# Patient Record
Sex: Female | Born: 1995 | Race: Black or African American | Hispanic: No | Marital: Single | Smoking: Never smoker
Health system: Southern US, Community
[De-identification: ages and names within clinical notes are randomized; demographics above are authoritative.]

## PROBLEM LIST (undated history)

## (undated) DIAGNOSIS — S060X9A Concussion with loss of consciousness of unspecified duration, initial encounter: Secondary | ICD-10-CM

## (undated) DIAGNOSIS — S060XAA Concussion with loss of consciousness status unknown, initial encounter: Secondary | ICD-10-CM

---
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
## 2017-10-30 ENCOUNTER — Ambulatory Visit (INDEPENDENT_AMBULATORY_CARE_PROVIDER_SITE_OTHER): Payer: Managed Care, Other (non HMO) | Admitting: Sports Medicine

## 2017-10-30 ENCOUNTER — Encounter: Payer: Self-pay | Admitting: Sports Medicine

## 2017-10-30 VITALS — BP 110/64 | Ht 61.0 in | Wt 123.0 lb

## 2017-10-30 DIAGNOSIS — M2141 Flat foot [pes planus] (acquired), right foot: Secondary | ICD-10-CM | POA: Diagnosis not present

## 2017-10-30 DIAGNOSIS — M7652 Patellar tendinitis, left knee: Secondary | ICD-10-CM | POA: Diagnosis not present

## 2017-10-30 DIAGNOSIS — M2142 Flat foot [pes planus] (acquired), left foot: Secondary | ICD-10-CM

## 2017-10-30 DIAGNOSIS — M7651 Patellar tendinitis, right knee: Secondary | ICD-10-CM | POA: Diagnosis not present

## 2017-10-30 NOTE — Progress Notes (Signed)
   Subjective:    Patient ID: Gabriella Mccoy, female    DOB: 12-Jan-1996, 22 y.o.   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  No injury that she can recall but a gradual onset of pain that is worse with running and jumping.  She denies any swelling.  No mechanical symptoms. She is also requesting a new pair of custom orthotics.  She had custom orthotics created one year ago which were very comfortable but have since worn out.  Interim medical history reviewed Medications reviewed Allergies reviewed    Review of Systems    As above Objective:   Physical Exam  Well-developed, well-nourished.  No acute distress.  Awake alert and oriented x3.  Vital signs reviewed  Examination of both knees show full range of motion.  No effusion.  She does have palpable crepitus over the bilateral patellar tendons. Knees are stable to ligamentous exam.  No joint line tenderness.  Patient has pes planus with standing.      Assessment & Plan:  Bilateral patellar tendinitis Pes planus  Custom orthotics were created for her today.  She will start decline squats in the training room and she will be fitted with bilateral patellar straps to wear when active.  I recommended ice and anti-inflammatories as needed.  I will follow her progress through the training room at [facility].  She will follow-up with me formally as needed. Total time spent with the patient was 30 minutes with greater than 50% of the time spent in face-to-face consultation discussing her diagnosis of patellar tendinitis as well as constructing and fitting her orthotics.

## 2017-11-28 ENCOUNTER — Ambulatory Visit (INDEPENDENT_AMBULATORY_CARE_PROVIDER_SITE_OTHER): Payer: 59 | Admitting: Sports Medicine

## 2017-11-28 VITALS — BP 108/60 | Ht 61.5 in | Wt 120.0 lb

## 2017-11-28 DIAGNOSIS — M2141 Flat foot [pes planus] (acquired), right foot: Secondary | ICD-10-CM | POA: Diagnosis not present

## 2017-11-28 DIAGNOSIS — M7662 Achilles tendinitis, left leg: Secondary | ICD-10-CM

## 2017-11-28 DIAGNOSIS — M2142 Flat foot [pes planus] (acquired), left foot: Secondary | ICD-10-CM | POA: Diagnosis not present

## 2017-11-28 DIAGNOSIS — M7661 Achilles tendinitis, right leg: Secondary | ICD-10-CM

## 2017-11-29 ENCOUNTER — Encounter: Payer: Self-pay | Admitting: Sports Medicine

## 2017-11-29 NOTE — Progress Notes (Addendum)
   Subjective:    Patient ID: Gabriella Mccoy, female    DOB: 07-06-1995, 22 y.o.   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  She has had custom orthotics in the past.  She has a history of patellar tendinopathy and pes planus.  She would like new orthotics for her basketball shoes.  Interim medical history reviewed Medications reviewed Allergies reviewed    Review of Systems As above    Objective:   Physical Exam  Well-developed, well-nourished.  No acute distress.  Awake alert and oriented x3.  Vital signs reviewed.  Patient has significant pes planus with standing.  Pronation with walking.  No soft tissue swelling.  Good pulses.  Walking without a limp.     Assessment & Plan:   Pes planus History of patellar tendinopathy  Custom orthotics were created for her today.  She found them to be comfortable prior to leaving the office.  Good correction of her pes planus and pronation with orthotics in place.  Total time spent with the patient was 30 minutes with greater than 50% of the time spent in face-to-face consultation discussing orthotic construction, instruction, and fitting.  I will continue to follow her progress through the training room at [employer] [facility] she will follow-up with me formally in the office as needed.  Patient was fitted for a : standard, cushioned, semi-rigid orthotic. The orthotic was heated and afterward the patient stood on the orthotic blank positioned on the orthotic stand. The patient was positioned in subtalar neutral position and 10 degrees of ankle dorsiflexion in a weight bearing stance. After completion of molding, a stable base was applied to the orthotic blank. The blank was ground to a stable position for weight bearing. Size: 8 Base: Blue EVA.  Posting: none Additional orthotic padding: none

## 2018-06-19 IMAGING — CR DG CHEST 2V
2 series · 2 of 2 positions shown · non-contrast
Comparison: None.

CLINICAL DATA: Right upper quadrant pain and shortness of breath
for 1 week, initial encounter

EXAM:
CHEST  2 VIEW

[w chest pa]
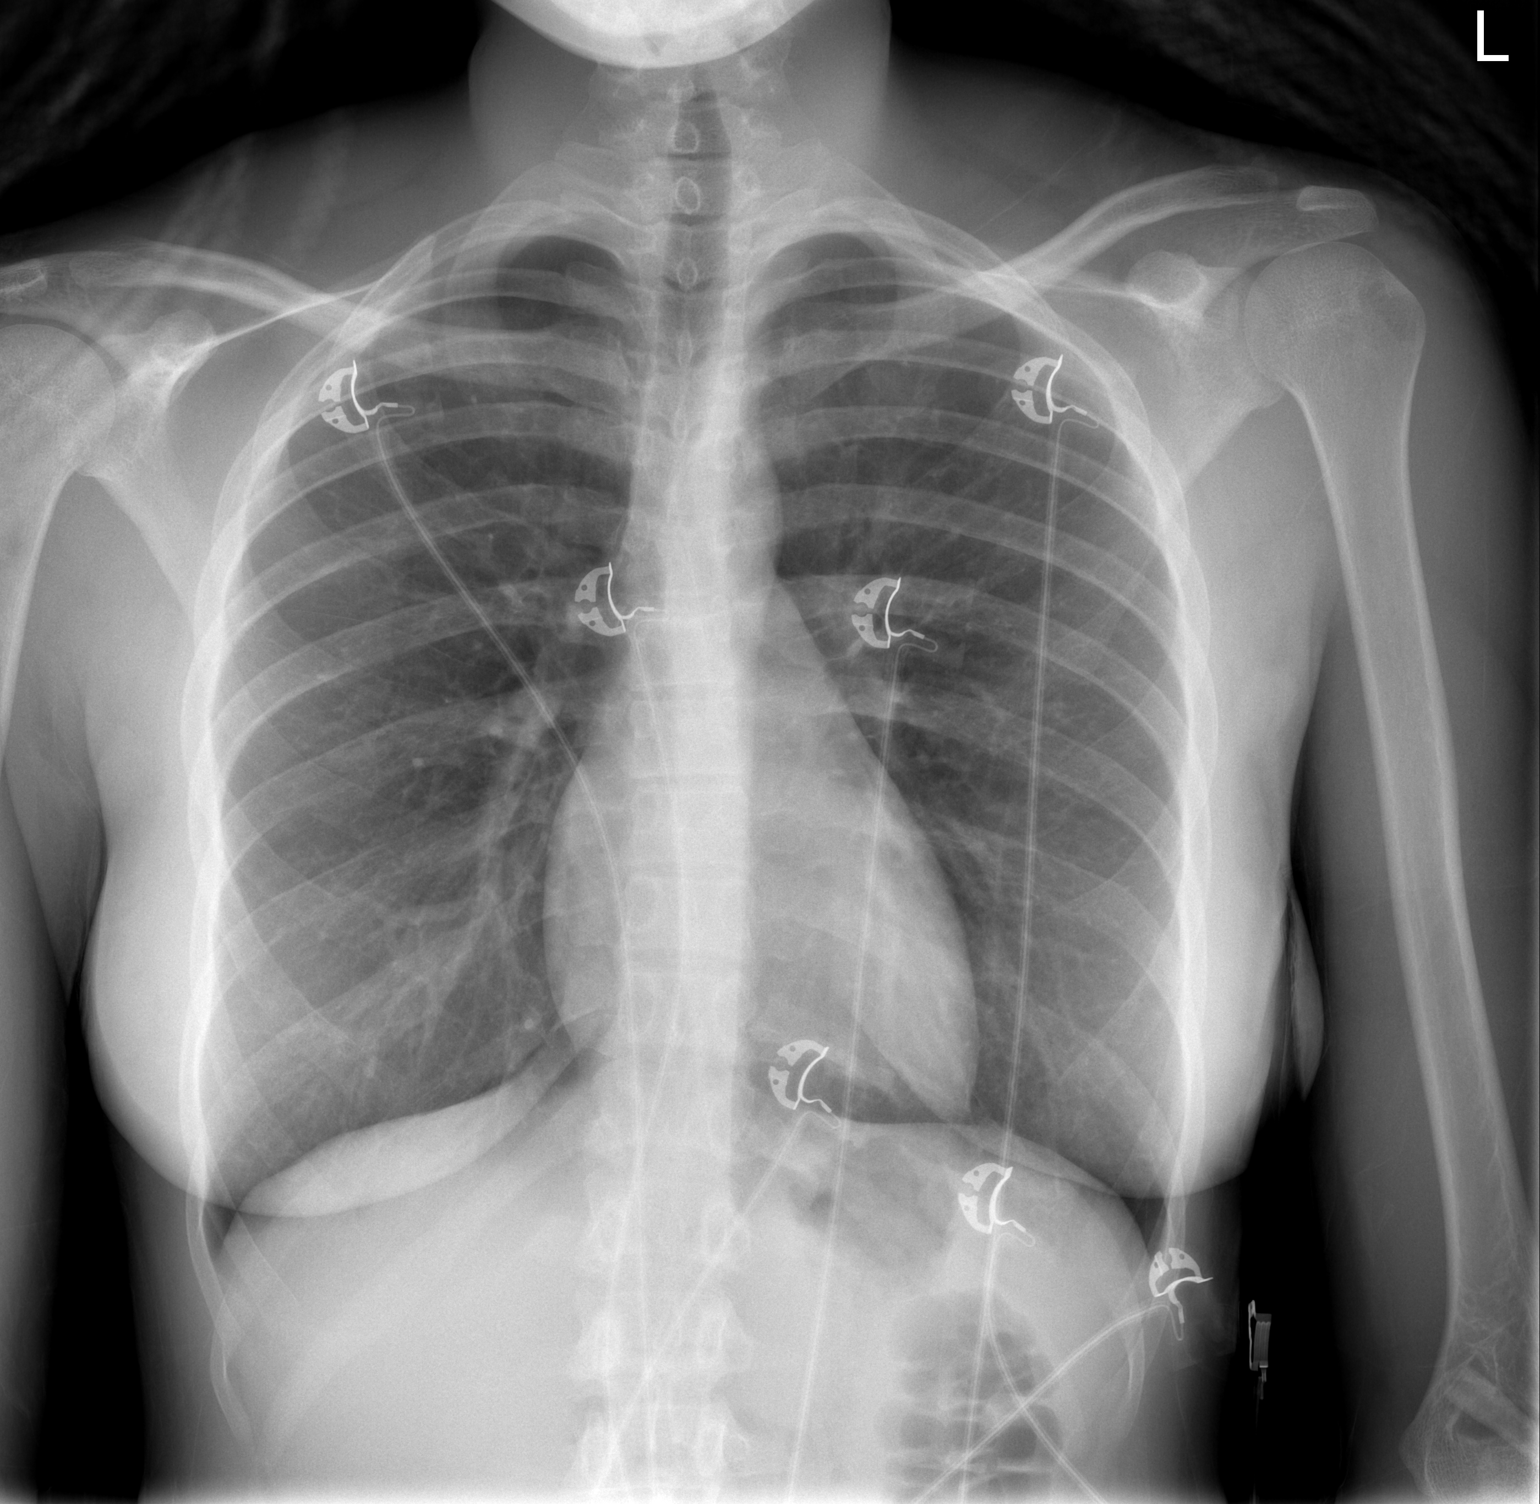

[w chest lat]
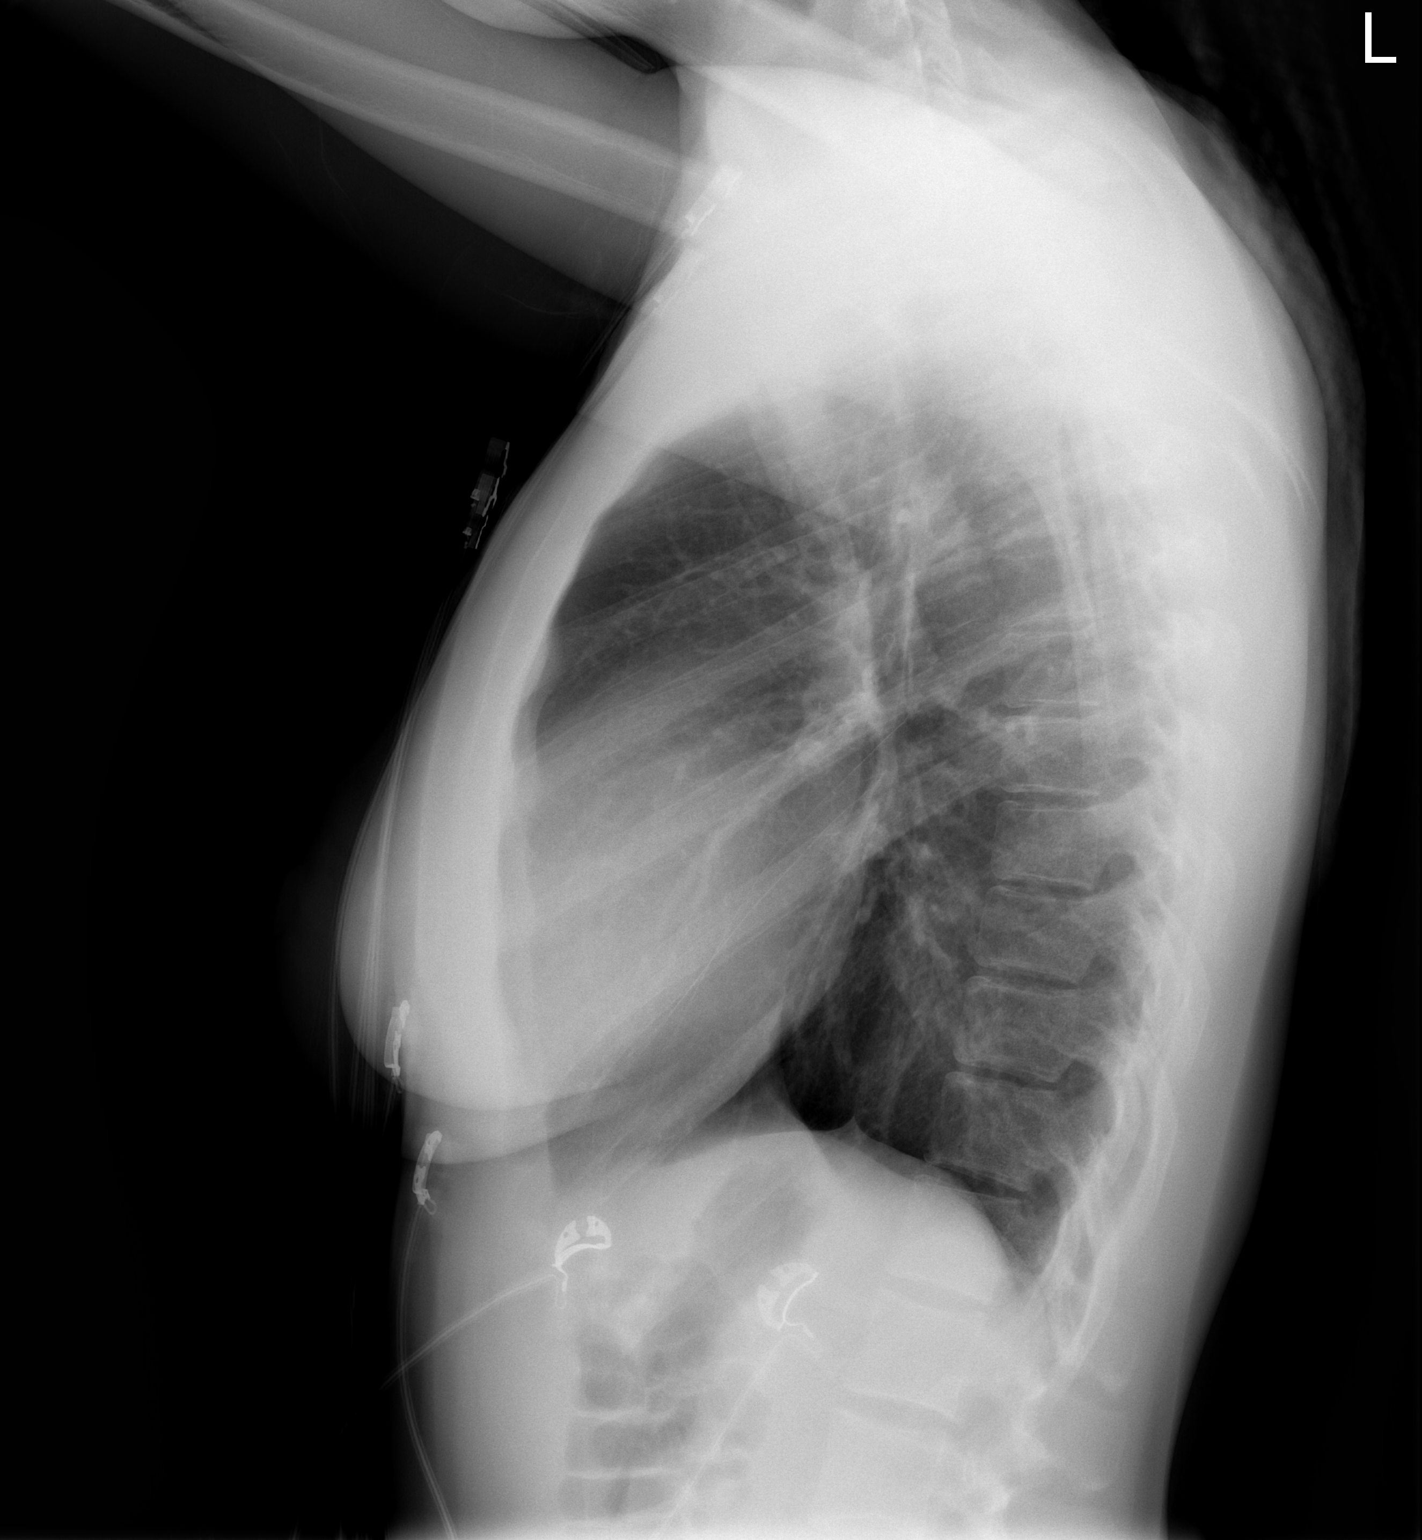

[2 of 2 positions shown; findings below may reference images not displayed]

FINDINGS: The heart size and mediastinal contours are within normal limits.
Both lungs are clear. The visualized skeletal structures are
unremarkable.
IMPRESSION: No active cardiopulmonary disease.
